# Patient Record
Sex: Male | Born: 2000 | Race: Black or African American | Hispanic: No | Marital: Single
Health system: Southern US, Community
[De-identification: ages and names within clinical notes are randomized; demographics above are authoritative.]

## PROBLEM LIST (undated history)

## (undated) DIAGNOSIS — J189 Pneumonia, unspecified organism: Secondary | ICD-10-CM

## (undated) DIAGNOSIS — E871 Hypo-osmolality and hyponatremia: Secondary | ICD-10-CM

## (undated) DIAGNOSIS — R739 Hyperglycemia, unspecified: Secondary | ICD-10-CM

## (undated) DIAGNOSIS — E119 Type 2 diabetes mellitus without complications: Secondary | ICD-10-CM

## (undated) DIAGNOSIS — I10 Essential (primary) hypertension: Secondary | ICD-10-CM

## (undated) DIAGNOSIS — N179 Acute kidney failure, unspecified: Secondary | ICD-10-CM

## (undated) HISTORY — DX: Hyperglycemia, unspecified: R73.9

## (undated) HISTORY — DX: Acute kidney failure, unspecified: N17.9

## (undated) HISTORY — DX: Hypo-osmolality and hyponatremia: E87.1

## (undated) NOTE — *Deleted (*Deleted)
3:25 PM  I assumed care of this patient at 1500 from Delbert Phenix, MD. Patient signed out pending BMP, CBC, and Troponin I. All imaging as interpreted by the reading Radiologist read as negative for acute abnormality. All lab results within normal limits ***. Patient is safe for discharge ***. Patient given strict return precautions.   Physical Exam  BP (!) 163/85   Pulse 79   Temp 98.3 F (36.8 C) (Oral) Comment: per NT  Resp 20   Wt (!) 261 lb 0.4 oz (118.4 kg)   SpO2 98%   Physical Exam  ED Course/Procedures     Procedures  MDM  *** Scribe's Attestation: Lewis Moccasin, MD obtained and performed the history, physical exam and medical decision making elements that were entered into the chart. Documentation assistance was provided by me personally, a scribe. Signed by Kathreen Cosier, Scribe on 11/15/2019 3:25 PM ? Documentation assistance provided by the scribe. I was present during the time the encounter was recorded. The information recorded by the scribe was done at my direction and has been reviewed and validated by me.  Vicki Mallet, MD

---
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
## 2021-02-04 ENCOUNTER — Other Ambulatory Visit: Payer: Self-pay | Admitting: Internal Medicine

## 2021-02-10 ENCOUNTER — Ambulatory Visit
Admission: EM | Admit: 2021-02-10 | Discharge: 2021-02-10 | Disposition: A | Discharge status: Home / Self Care | Payer: Managed Care, Other (non HMO) | Attending: Urgent Care | Admitting: Urgent Care

## 2021-02-10 ENCOUNTER — Telehealth: Payer: Self-pay | Admitting: Emergency Medicine

## 2021-02-10 ENCOUNTER — Other Ambulatory Visit: Payer: Self-pay

## 2021-02-10 ENCOUNTER — Telehealth: Payer: Self-pay | Admitting: Urgent Care

## 2021-02-10 ENCOUNTER — Encounter: Payer: Self-pay | Admitting: Emergency Medicine

## 2021-02-10 DIAGNOSIS — R42 Dizziness and giddiness: Secondary | ICD-10-CM | POA: Diagnosis not present

## 2021-02-10 DIAGNOSIS — R110 Nausea: Secondary | ICD-10-CM | POA: Diagnosis not present

## 2021-02-10 DIAGNOSIS — Z794 Long term (current) use of insulin: Secondary | ICD-10-CM

## 2021-02-10 DIAGNOSIS — E119 Type 2 diabetes mellitus without complications: Secondary | ICD-10-CM | POA: Diagnosis not present

## 2021-02-10 DIAGNOSIS — R519 Headache, unspecified: Secondary | ICD-10-CM

## 2021-02-10 LAB — POCT URINALYSIS DIP (MANUAL ENTRY)
Bilirubin, UA: NEGATIVE
Blood, UA: NEGATIVE
Glucose, UA: 500 mg/dL — AB
Leukocytes, UA: NEGATIVE
Nitrite, UA: NEGATIVE
Protein Ur, POC: NEGATIVE mg/dL
Spec Grav, UA: >=1.03 — AB (ref 1.010–1.025)
Urobilinogen, UA: 0.2 E.U./dL
pH, UA: 6 (ref 5.0–8.0)

## 2021-02-10 LAB — POCT FASTING CBG KUC MANUAL ENTRY: POCT Glucose (KUC): 293 mg/dL — AB (ref 70–99)

## 2021-02-10 MED ORDER — TRESIBA FLEXTOUCH 100 UNIT/ML ~~LOC~~ SOPN: 50.0000 [IU] | PEN_INJECTOR | Freq: Every day | SUBCUTANEOUS | 0 refills | Status: DC

## 2021-02-10 MED ORDER — NOVOLOG 70/30 FLEXPEN RELION (70-30) 100 UNIT/ML ~~LOC~~ SUPN: 20.0000 [IU] | PEN_INJECTOR | Freq: Two times a day (BID) | SUBCUTANEOUS | 0 refills | Status: DC

## 2021-02-10 MED ORDER — INSULIN LISPRO PROT & LISPRO (75-25 MIX) 100 UNIT/ML KWIKPEN: 15.0000 [IU] | PEN_INJECTOR | Freq: Two times a day (BID) | SUBCUTANEOUS | 0 refills | Status: DC

## 2021-02-10 NOTE — Telephone Encounter (Signed)
Novolog requires a prior authorization as notified by the pharmacy through fax. Unfortunately, after multiple attempts I have not been able to get in contact with his pharmacy. Their phone line does not work. Will have the patient simply take his Evaristo Bury for now and request that the pharmacy contact our office to find other options on his formulary.

## 2021-02-10 NOTE — ED Triage Notes (Signed)
Pt is DM type 2 and here after running out of insulin on 1/21 and misplacing his glucometer and being unable to take CBG readings for several days. States his sugars are 120 normally and he is well controlled and managed with last hospital admission for DKA being in 2017. Presents today with dizziness, headache and nausea since yesterday.

## 2021-02-10 NOTE — ED Provider Notes (Signed)
Dakota Silva   MRN: [id number] DOB: December 27, 2000  Subjective:   Dakota Silva is a 21 y.o. male with pmh of diabetes II treated with insulin presenting for medication refill of his insulin.  Ran out a few days ago.  He takes both immediate and long-acting insulin.  He is currently in the process of looking for a new PCP.  Has had some symptoms including dizziness, nausea, mild headaches.  Has a history of DKA, last episode was 02/2020. Last a1c was done 04/15/20 and was 10%.  Denies confusion, vision changes, chest pain, shortness of breath, abdominal pain, syncope.  No current facility-administered medications for this encounter.  Current Outpatient Medications:    Accu-Chek FastClix Lancets MISC, 1 each by Does not apply route as directed. Check sugar 6 x daily, Disp: 204 each, Rfl: 3   acetone, urine, test strip, Check ketones per protocol, Disp: 50 each, Rfl: 3   amLODipine (NORVASC) 10 MG tablet, Take 1 tablet (10 mg total) by mouth daily. Pt. Needs to make appt. With Cardiologist in order to receive further refills. Thank You. 1st Attempt., Disp: 30 tablet, Rfl: 0   amLODipine (NORVASC) 5 MG tablet, TAKE 2 TABLETS(10 MG) BY MOUTH DAILY, Disp: 90 tablet, Rfl: 3   Continuous Blood Gluc Sensor (DEXCOM G6 SENSOR) MISC, 1 Device by Does not apply route as directed., Disp: 9 each, Rfl: 3   Continuous Blood Gluc Transmit (DEXCOM G6 TRANSMITTER) MISC, 1 Device by Does not apply route as directed., Disp: 1 each, Rfl: 3   dapagliflozin propanediol (FARXIGA) 5 MG TABS tablet, Take 1 tablet (5 mg total) by mouth daily before breakfast., Disp: 30 tablet, Rfl: 6   glucagon 1 MG injection, Use for Severe Hypoglycemia . Inject 1 mg intramuscularly if unresponsive, unable to swallow, unconscious and/or has seizure, Disp: 2 kit, Rfl: 1   glucose blood (ACCU-CHEK GUIDE) test strip, Use as instructed for 6 checks per day plus per protocol for hyper/hypoglycemia, Disp: 200 each, Rfl: 5   ibuprofen  (ADVIL) 800 MG tablet, Take 1 tablet (800 mg total) by mouth 3 (three) times daily., Disp: 21 tablet, Rfl: 0   Insulin Aspart FlexPen 100 UNIT/ML SOPN, ADMINISTER UP TO 75 UNITS UNDER THE SKIN EVERY DAY AS DIRECTED, Disp: 30 mL, Rfl: 3   insulin degludec (TRESIBA FLEXTOUCH) 100 UNIT/ML FlexTouch Pen, ADMINISTER UP TO 50 UNITS UNDER THE SKIN EVERY DAY, Disp: 15 mL, Rfl: 0   Insulin Pen Needle (INSUPEN PEN NEEDLES) 32G X 4 MM MISC, Inject insulin via insulin pen 4 x daily, Disp: 150 each, Rfl: 3   lisinopril (ZESTRIL) 20 MG tablet, Take 1 tablet (20 mg total) by mouth daily., Disp: 90 tablet, Rfl: 3   metFORMIN (GLUCOPHAGE) 500 MG tablet, Take 1 tablet by mouth twice daily, Disp: 60 tablet, Rfl: 3   mupirocin ointment (BACTROBAN) 2 %, Apply 1 application topically 2 (two) times daily., Disp: 30 g, Rfl: 0   pantoprazole (PROTONIX) 20 MG tablet, TAKE 1 TABLET(20 MG) BY MOUTH DAILY, Disp: 60 tablet, Rfl: 0   rosuvastatin (CRESTOR) 5 MG tablet, Take 2 tablets (10 mg total) by mouth daily., Disp: 90 tablet, Rfl: 3   Vitamin D, Ergocalciferol, (DRISDOL) 1.25 MG (50000 UNIT) CAPS capsule, Take 1 capsule (50,000 Units total) by mouth every 7 (seven) days., Disp: 5 capsule, Rfl: 4   Allergies  Allergen Reactions   Fish Allergy Swelling    Mouth and face swelling    Past Medical History:  Diagnosis Date  Abnormal weight loss 09/27/2017   Acanthosis nigricans 01/20/2017   AKI (acute kidney injury) ([place])    Chest pain of uncertain etiology 05/25/3265   Diabetes mellitus without complication ([place])    DKA (diabetic ketoacidoses) 11/29/2016   Hyperglycemia    Hypertension    Hyponatremia    Maladaptive health behaviors affecting medical condition 10/13/2017   Morbid obesity ([place]) 01/20/2017   New onset of type 2 diabetes mellitus in pediatric patient ([place]) 09/21/2016   Noncompliance with diabetes treatment 09/27/2017   Palpitations 01/30/2020   Pneumonia    Uncontrolled type 2 diabetes mellitus with  hyperglycemia ([place]) 09/13/2018     History reviewed. No pertinent surgical history.  Family History  Problem Relation Age of Onset   Diabetes Mother    Hypertension Mother    Diabetes Father     Social History   Tobacco Use   Smoking status: Passive Smoke Exposure - Never Smoker   Smokeless tobacco: Never   Tobacco comments:    Family smokes outside  Substance Use Topics   Alcohol use: No   Drug use: No    ROS   Objective:   Vitals: BP 128/89    Pulse 84    Temp 98.9 F (37.2 C)    Resp 20    SpO2 98%   Physical Exam Constitutional:      General: He is not in acute distress.    Appearance: Normal appearance. He is well-developed and normal weight. He is not ill-appearing, toxic-appearing or diaphoretic.  HENT:     Head: Normocephalic and atraumatic.     Right Ear: External ear normal.     Left Ear: External ear normal.     Nose: Nose normal.     Mouth/Throat:     Mouth: Mucous membranes are moist.  Eyes:     General: No scleral icterus.       Right eye: No discharge.        Left eye: No discharge.     Extraocular Movements: Extraocular movements intact.     Conjunctiva/sclera: Conjunctivae normal.     Pupils: Pupils are equal, round, and reactive to light.  Cardiovascular:     Rate and Rhythm: Normal rate and regular rhythm.     Heart sounds: Normal heart sounds. No murmur heard.   No friction rub. No gallop.  Pulmonary:     Effort: Pulmonary effort is normal. No respiratory distress.     Breath sounds: Normal breath sounds. No stridor. No wheezing, rhonchi or rales.  Abdominal:     General: Bowel sounds are normal. There is no distension.     Palpations: Abdomen is soft. There is no mass.     Tenderness: There is no abdominal tenderness. There is no right CVA tenderness, left CVA tenderness, guarding or rebound.  Skin:    General: Skin is warm and dry.  Neurological:     Mental Status: He is alert and oriented to person, place, and time.     Cranial  Nerves: No cranial nerve deficit, dysarthria or facial asymmetry.     Motor: No weakness.     Coordination: Coordination normal.     Gait: Gait normal.  Psychiatric:        Mood and Affect: Mood normal.        Speech: Speech normal.        Behavior: Behavior normal.        Thought Content: Thought content normal.    Results for orders placed or  performed during the hospital encounter of 02/10/21 (from the past 24 hour(s))  POCT CBG (manual entry)     Status: Abnormal   Collection Time: 02/10/21  9:48 AM  Result Value Ref Range   POCT Glucose ([facility]) 293 (A) 70 - 99 mg/dL  POCT urinalysis dipstick     Status: Abnormal   Collection Time: 02/10/21  9:48 AM  Result Value Ref Range   Color, UA yellow yellow   Clarity, UA clear clear   Glucose, UA =500 (A) negative mg/dL   Bilirubin, UA negative negative   Ketones, POC UA small (15) (A) negative mg/dL   Spec Grav, UA >=1.030 (A) 1.010 - 1.025   Blood, UA negative negative   pH, UA 6.0 5.0 - 8.0   Protein Ur, POC negative negative mg/dL   Urobilinogen, UA 0.2 0.2 or 1.0 E.U./dL   Nitrite, UA Negative Negative   Leukocytes, UA Negative Negative    Assessment and Plan :   PDMP not reviewed this encounter.  1. Type 2 diabetes mellitus treated with insulin ([place])   2. Dizziness   3. Mild headache   4. Nausea without vomiting     I reviewed patient's medications with him very specifically.  He confirms that he is on NovoLog 70/30 and takes 20 units twice daily.  He is also on [place] and takes 50 units at bedtime.  I did help patient try to find a new primary care provider through the [facility] health website.  He plans on making an appointment as soon as possible.  At this point I am not concerned for DKA with the patient.  But emphasized strict diabetic control to avoid a recurrence of this. Counseled patient on potential for adverse effects with medications prescribed/recommended today, ER and return-to-clinic precautions discussed, patient  verbalized understanding.    Jaynee Eagles, PA-C 02/10/21 1038

## 2021-02-10 NOTE — Telephone Encounter (Signed)
RN Ubaldo Glassing was able to obtain alternatives.  We will use a different intermediate insulin of Humalog KwikPen 75/25 as this is on his formulary.  We will use 15 units twice daily with meals.

## 2021-02-11 ENCOUNTER — Telehealth: Payer: Self-pay | Admitting: Urgent Care

## 2021-02-11 MED ORDER — NOVOLOG 70/30 FLEXPEN RELION (70-30) 100 UNIT/ML ~~LOC~~ SUPN: 20.0000 [IU] | PEN_INJECTOR | Freq: Two times a day (BID) | SUBCUTANEOUS | 0 refills | Status: DC

## 2021-02-11 NOTE — Telephone Encounter (Signed)
Patient's mother called and requested a prescription be sent for NovoLog as the Humalog that is on there formulary is actually on backorder.  We notify patient's mother that she would have to pay out-of-pocket for this as it is not on her formulary and I am not able to do a prior authorization for this.  Prescription will be sent to the [facility] on [address] for NovoLog 70/30 at a dose of 20 units twice daily with meals.  Further refills need to be provided by the new PCP as we discussed in clinic.

## 2021-02-11 NOTE — Telephone Encounter (Signed)
Patient's mother called the clinic again.  She wants the prescription changed again.  I am agreeable to do this and will use short acting NovoLog.  I discussed this with the pharmacist directly and will be using 10 units 3 times daily with meals.  She will be dispensing 30 mils.  No refills.

## 2021-03-15 ENCOUNTER — Encounter (HOSPITAL_COMMUNITY): Payer: Self-pay | Admitting: *Deleted

## 2021-03-15 ENCOUNTER — Emergency Department (HOSPITAL_COMMUNITY): Payer: 59

## 2021-03-15 ENCOUNTER — Other Ambulatory Visit: Payer: Self-pay

## 2021-03-15 ENCOUNTER — Emergency Department (HOSPITAL_COMMUNITY)
Admission: EM | Admit: 2021-03-15 | Discharge: 2021-03-16 | Disposition: A | Discharge status: Home / Self Care | Payer: 59 | Attending: Emergency Medicine | Admitting: Emergency Medicine

## 2021-03-15 DIAGNOSIS — E119 Type 2 diabetes mellitus without complications: Secondary | ICD-10-CM | POA: Insufficient documentation

## 2021-03-15 DIAGNOSIS — W228XXA Striking against or struck by other objects, initial encounter: Secondary | ICD-10-CM | POA: Diagnosis not present

## 2021-03-15 DIAGNOSIS — I10 Essential (primary) hypertension: Secondary | ICD-10-CM | POA: Insufficient documentation

## 2021-03-15 DIAGNOSIS — Z794 Long term (current) use of insulin: Secondary | ICD-10-CM | POA: Diagnosis not present

## 2021-03-15 DIAGNOSIS — Z7984 Long term (current) use of oral hypoglycemic drugs: Secondary | ICD-10-CM | POA: Insufficient documentation

## 2021-03-15 DIAGNOSIS — Z23 Encounter for immunization: Secondary | ICD-10-CM | POA: Insufficient documentation

## 2021-03-15 DIAGNOSIS — S99921A Unspecified injury of right foot, initial encounter: Secondary | ICD-10-CM

## 2021-03-15 DIAGNOSIS — S90811A Abrasion, right foot, initial encounter: Secondary | ICD-10-CM | POA: Insufficient documentation

## 2021-03-15 DIAGNOSIS — Z79899 Other long term (current) drug therapy: Secondary | ICD-10-CM | POA: Insufficient documentation

## 2021-03-15 MED ORDER — NAPROXEN 500 MG PO TABS: 500.0000 mg | ORAL_TABLET | Freq: Two times a day (BID) | ORAL | 0 refills | Status: DC | PRN

## 2021-03-15 MED ORDER — NAPROXEN 500 MG PO TABS: 500.0000 mg | ORAL_TABLET | Freq: Two times a day (BID) | ORAL | 0 refills | Status: AC | PRN

## 2021-03-15 MED ORDER — BACITRACIN ZINC 500 UNIT/GM EX OINT
TOPICAL_OINTMENT | Freq: Two times a day (BID) | CUTANEOUS | Status: DC
  Filled 2021-03-15: qty 0.9

## 2021-03-15 MED ORDER — BACITRACIN ZINC 500 UNIT/GM EX OINT: 1.0000 "application " | TOPICAL_OINTMENT | Freq: Two times a day (BID) | CUTANEOUS | 0 refills | Status: DC

## 2021-03-15 MED ORDER — TETANUS-DIPHTH-ACELL PERTUSSIS 5-2.5-18.5 LF-MCG/0.5 IM SUSY
0.5000 mL | PREFILLED_SYRINGE | Freq: Once | INTRAMUSCULAR | Status: AC
  Administered 2021-03-15: 0.5 mL via INTRAMUSCULAR
  Filled 2021-03-15: qty 0.5

## 2021-03-15 MED ORDER — BACITRACIN ZINC 500 UNIT/GM EX OINT: 1.0000 "application " | TOPICAL_OINTMENT | Freq: Two times a day (BID) | CUTANEOUS | 0 refills | Status: AC

## 2021-03-15 MED ORDER — NAPROXEN 500 MG PO TABS
500.0000 mg | ORAL_TABLET | Freq: Once | ORAL | Status: AC
  Administered 2021-03-15: 500 mg via ORAL
  Filled 2021-03-15: qty 1
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
## 2021-03-15 NOTE — Discharge Instructions (Addendum)
Please read and follow all provided instructions.  You have been seen today for an injury to your right foot.   Tests performed today include: An x-ray of the affected area - does NOT show any broken bones or dislocations.  Vital signs. See below for your results today.   Home care instructions: -- *PRICE in the first 24-48 hours after injury: Protect (with brace, splint, sling), if given by your provider Rest Ice- Do not apply ice pack directly to your skin, place towel or similar between your skin and ice/ice pack. Apply ice for 20 min, then remove for 40 min while awake Compression- Wear brace, elastic bandage, splint as directed by your provider Elevate affected extremity above the level of your heart when not walking around for the first 24-48 hours   Medications:  - Naproxen is a nonsteroidal anti-inflammatory medication that will help with pain and swelling. Be sure to take this medication as prescribed with food, 1 pill every 12 hours,  It should be taken with food, as it can cause stomach upset, and more seriously, stomach bleeding. Do not take other nonsteroidal anti-inflammatory medications with this such as Advil, Motrin, Aleve, Mobic, Goodie Powder, or Motrin.    - Bacitracin- apply twice per day to wound.   You make take Tylenol per over the counter dosing with these medications.   We have prescribed you new medication(s) today. Discuss the medications prescribed today with your pharmacist as they can have adverse effects and interactions with your other medicines including over the counter and prescribed medications. Seek medical evaluation if you start to experience new or abnormal symptoms after taking one of these medicines, seek care immediately if you start to experience difficulty breathing, feeling of your throat closing, facial swelling, or rash as these could be indications of a more serious allergic reaction   Follow-up instructions: Please follow-up with your  primary care provider or the provided orthopedic physician (bone specialist) if you continue to have significant pain in 1 week. In this case you may have a more severe injury that requires further care. Follow attach wound care instructions. Use crutches as needed.   Return instructions:  Please return if your digits or extremity are numb or tingling, appear gray or blue, or you have severe pain (also elevate the extremity and loosen splint or wrap if you were given one) Please return if you have redness or fevers.  Please return to the Emergency Department if you experience worsening symptoms.  Please return if you have any other emergent concerns. Additional Information:  Your vital signs today were: BP (!) 169/96 (BP Location: Right Arm)    Pulse (!) 105    Temp 99.1 F (37.3 C) (Oral)    Resp 16    Ht 5\' 11"  (1.803 m)    Wt 108.9 kg    SpO2 100%    BMI 33.47 kg/m  If your blood pressure (BP) was elevated above 135/85 this visit, please have this repeated by your doctor within one month. ---------------
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
## 2021-03-16 ENCOUNTER — Other Ambulatory Visit: Payer: Self-pay | Admitting: Internal Medicine

## 2021-03-16 DIAGNOSIS — I10 Essential (primary) hypertension: Secondary | ICD-10-CM

## 2021-03-17 ENCOUNTER — Other Ambulatory Visit: Payer: Self-pay | Admitting: Internal Medicine

## 2021-03-27 ENCOUNTER — Other Ambulatory Visit: Payer: Self-pay

## 2021-03-27 ENCOUNTER — Encounter: Payer: Self-pay | Admitting: Emergency Medicine

## 2021-03-27 ENCOUNTER — Emergency Department
Admission: EM | Admit: 2021-03-27 | Discharge: 2021-03-27 | Disposition: A | Discharge status: Home / Self Care | Payer: 59 | Attending: Emergency Medicine | Admitting: Emergency Medicine

## 2021-03-27 DIAGNOSIS — E1165 Type 2 diabetes mellitus with hyperglycemia: Secondary | ICD-10-CM | POA: Insufficient documentation

## 2021-03-27 DIAGNOSIS — E119 Type 2 diabetes mellitus without complications: Secondary | ICD-10-CM

## 2021-03-27 DIAGNOSIS — Z794 Long term (current) use of insulin: Secondary | ICD-10-CM | POA: Insufficient documentation

## 2021-03-27 DIAGNOSIS — I10 Essential (primary) hypertension: Secondary | ICD-10-CM | POA: Insufficient documentation

## 2021-03-27 DIAGNOSIS — R739 Hyperglycemia, unspecified: Secondary | ICD-10-CM

## 2021-03-27 LAB — CBG MONITORING, ED
Glucose-Capillary: 455 mg/dL — ABNORMAL HIGH (ref 70–99)
Glucose-Capillary: 275 mg/dL — ABNORMAL HIGH (ref 70–99)

## 2021-03-27 LAB — CBC WITH DIFFERENTIAL/PLATELET
Abs Immature Granulocytes: 0.03 10*3/uL (ref 0.00–0.07)
Basophils Absolute: 0 10*3/uL (ref 0.0–0.1)
Basophils Relative: 0 %
Eosinophils Absolute: 0.1 10*3/uL (ref 0.0–0.5)
Eosinophils Relative: 2 %
HCT: 41.9 % (ref 39.0–52.0)
Hemoglobin: 12.7 g/dL — ABNORMAL LOW (ref 13.0–17.0)
Immature Granulocytes: 0 %
Lymphocytes Relative: 34 %
Lymphs Abs: 2.8 10*3/uL (ref 0.7–4.0)
MCH: 20.4 pg — ABNORMAL LOW (ref 26.0–34.0)
MCHC: 30.3 g/dL (ref 30.0–36.0)
MCV: 67.3 fL — ABNORMAL LOW (ref 80.0–100.0)
Monocytes Absolute: 0.4 10*3/uL (ref 0.1–1.0)
Monocytes Relative: 4 %
Neutro Abs: 4.8 10*3/uL (ref 1.7–7.7)
Neutrophils Relative %: 60 %
Platelets: 387 10*3/uL (ref 150–400)
RBC: 6.23 MIL/uL — ABNORMAL HIGH (ref 4.22–5.81)
RDW: 15.1 % (ref 11.5–15.5)
WBC: 8.1 10*3/uL (ref 4.0–10.5)
nRBC: 0 % (ref 0.0–0.2)

## 2021-03-27 LAB — BASIC METABOLIC PANEL
Anion gap: 10 (ref 5–15)
BUN: 11 mg/dL (ref 6–20)
CO2: 26 mmol/L (ref 22–32)
Calcium: 9.4 mg/dL (ref 8.9–10.3)
Chloride: 93 mmol/L — ABNORMAL LOW (ref 98–111)
Creatinine, Ser: 0.83 mg/dL (ref 0.61–1.24)
GFR, Estimated: >60 mL/min (ref >60)
Glucose, Bld: 450 mg/dL — ABNORMAL HIGH (ref 70–99)
Potassium: 4.1 mmol/L (ref 3.5–5.1)
Sodium: 129 mmol/L — ABNORMAL LOW (ref 135–145)

## 2021-03-27 LAB — BETA-HYDROXYBUTYRIC ACID: Beta-Hydroxybutyric Acid: 0.09 mmol/L (ref 0.05–0.27)

## 2021-03-27 MED ORDER — ACCU-CHEK FASTCLIX LANCETS MISC: 1.0000 | 1 refills | Status: DC

## 2021-03-27 MED ORDER — NOVOLOG 70/30 FLEXPEN RELION (70-30) 100 UNIT/ML ~~LOC~~ SUPN: 20.0000 [IU] | PEN_INJECTOR | Freq: Two times a day (BID) | SUBCUTANEOUS | 1 refills | Status: AC

## 2021-03-27 MED ORDER — TRESIBA FLEXTOUCH 100 UNIT/ML ~~LOC~~ SOPN: 50.0000 [IU] | PEN_INJECTOR | Freq: Every day | SUBCUTANEOUS | 1 refills | Status: AC

## 2021-03-27 MED ORDER — INSULIN ASPART 100 UNIT/ML IJ SOLN
10.0000 [IU] | Freq: Once | INTRAMUSCULAR | Status: AC
  Administered 2021-03-27: 10 [IU] via INTRAVENOUS
  Filled 2021-03-27: qty 1

## 2021-03-27 MED ORDER — SODIUM CHLORIDE 0.9 % IV BOLUS
1000.0000 mL | Freq: Once | INTRAVENOUS | Status: AC
  Administered 2021-03-27: 1000 mL via INTRAVENOUS

## 2021-03-27 MED ORDER — DAPAGLIFLOZIN PROPANEDIOL 5 MG PO TABS: 5.0000 mg | ORAL_TABLET | Freq: Every day | ORAL | 1 refills | Status: AC

## 2021-03-27 MED ORDER — ACCU-CHEK GUIDE VI STRP: ORAL_STRIP | 1 refills | Status: DC

## 2021-03-27 NOTE — ED Triage Notes (Signed)
Pt reports has diabetes and has been out of his insulin for 3 days so his blood sugar is running high. ?

## 2021-03-27 NOTE — ED Provider Notes (Signed)
? ?[facility] ?Provider Note ? ? ? Event Date/Time  ? First MD Initiated Contact with Patient 03/27/21 1046   ?  (approximate) ? ? ?History  ? ?Hyperglycemia ? ? ?HPI ? ?Dakota Silva is a 21 y.o. male   with history of type 2 diabetes, hypertension, here with hyperglycemia.  Patient states that he recently ran out of insulin.  He actually does not have a PCP and is scheduled to see 1 in May but was unable to get back in with anyone.  He states that he has been out of his short acting insulin.  Reports has been mildly thirsty but denies any shortness of breath, nausea, or vomiting.  Has a history of DKA and states he does not feel like he is in this.  Denies any fevers or chills.  No recent illnesses.  No other medical complaints.  States he has been taking most of his other medications. ? ?  ? ? ?Physical Exam  ? ?Triage Vital Signs: ?ED Triage Vitals  ?Enc Vitals Group  ?   BP 03/27/21 1021 (!) 142/88  ?   Pulse Rate 03/27/21 1021 91  ?   Resp 03/27/21 1021 18  ?   Temp 03/27/21 1021 98.5 ?F (36.9 ?C)  ?   Temp Source 03/27/21 1021 Oral  ?   SpO2 03/27/21 1021 98 %  ?   Weight 03/27/21 1012 245 lb (111.1 kg)  ?   Height 03/27/21 1012 5\' 11"  (1.803 m)  ?   Head Circumference --   ?   Peak Flow --   ?   Pain Score 03/27/21 1012 0  ?   Pain Loc --   ?   Pain Edu? --   ?   Excl. in GC? --   ? ? ?Most recent vital signs: ?Vitals:  ? 03/27/21 1130 03/27/21 1245  ?BP: (!) 154/83 (!) 144/69  ?Pulse: 80 80  ?Resp:  16  ?Temp:    ?SpO2: 97% 98%  ? ? ? ?General: Awake, no distress.  ?CV:  Good peripheral perfusion.  Regular rate and rhythm, no murmurs ?Resp:  Normal effort.  Lungs clear to auscultation bilaterally.  No tachypnea. ?Abd:  No distention.  ?Other:  Mildly dry mucous membranes. ? ? ?ED Results / Procedures / Treatments  ? ?Labs ?(all labs ordered are listed, but only abnormal results are displayed) ?Labs Reviewed  ?BASIC METABOLIC PANEL - Abnormal; Notable for the following components:  ?     Result Value  ? Sodium 129 (*)   ? Chloride 93 (*)   ? Glucose, Bld 450 (*)   ? All other components within normal limits  ?CBC WITH DIFFERENTIAL/PLATELET - Abnormal; Notable for the following components:  ? RBC 6.23 (*)   ? Hemoglobin 12.7 (*)   ? MCV 67.3 (*)   ? MCH 20.4 (*)   ? All other components within normal limits  ?CBG MONITORING, ED - Abnormal; Notable for the following components:  ? Glucose-Capillary 455 (*)   ? All other components within normal limits  ?CBG MONITORING, ED - Abnormal; Notable for the following components:  ? Glucose-Capillary 275 (*)   ? All other components within normal limits  ?BETA-HYDROXYBUTYRIC ACID  ? ? ? ?EKG ? ? ? ?RADIOLOGY ? ?PROCEDURES: ? ?Critical Care performed: No ? ? ?MEDICATIONS ORDERED IN ED: ?Medications  ?insulin aspart (novoLOG) injection 10 Units (10 Units Intravenous Given 03/27/21 1113)  ?sodium chloride 0.9 % bolus 1,000 mL (0  mLs Intravenous Stopped 03/27/21 1150)  ? ? ? ?IMPRESSION / MDM / ASSESSMENT AND PLAN / ED COURSE  ?I reviewed the triage vital signs and the nursing notes. ?             ?               ? ?MDM:  ?Well-appearing 21 year old male with type 2 diabetes here with hyperglycemia and need for medication refills.  Overall, the patient is very well-appearing and in no distress.  He is hyperglycemic but has bicarb of 26, normal anion gap of 10, and negative beta hydroxybutyric acid.  Do not suspect DKA.  CBC is largely unremarkable.   ? ?Pt given fluids, insulin with improvement in BG. He remains HDS. I had a long discussion with pt and his mother re: need for adherence with medications. He has an appt with a new PCP in the next month. Will d/c with brief course of his chronic meds including insulin, encouraged dietary modifications, and d/c home. Pt in agreement.  ? ? ? ? ?MEDICATIONS GIVEN IN ED: ?Medications  ?insulin aspart (novoLOG) injection 10 Units (10 Units Intravenous Given 03/27/21 1113)  ?sodium chloride 0.9 % bolus 1,000 mL (0 mLs  Intravenous Stopped 03/27/21 1150)  ? ? ? ?Consults:  ?None ? ? ?EMR reviewed  ?Prior ED visits ? ? ? ? ?FINAL CLINICAL IMPRESSION(S) / ED DIAGNOSES  ? ?Final diagnoses:  ?Hyperglycemia  ?Type 2 diabetes mellitus without complication, with long-term current use of insulin (HCC)  ? ? ? ?Rx / DC Orders  ? ?ED Discharge Orders   ? ?      Ordered  ?  insulin aspart protamine - aspart (NOVOLOG 70/30 FLEXPEN) (70-30) 100 UNIT/ML FlexPen  2 times daily with meals       ?Note to Pharmacy: Patient and his mother is specifically requesting Novolog pen. They will pay out of pocket as I cannot do a prior authorization.  ? 03/27/21 1235  ?  glucose blood (ACCU-CHEK GUIDE) test strip       ? 03/27/21 1235  ?  Accu-Chek FastClix Lancets MISC  As directed       ?Note to Pharmacy: Lancets come in boxes of 102 each. Please dispense 2 boxes. For questions regarding this prescription please call [phone].  ? 03/27/21 1235  ?  dapagliflozin propanediol (FARXIGA) 5 MG TABS tablet  Daily before breakfast       ? 03/27/21 1235  ?  insulin degludec (TRESIBA FLEXTOUCH) 100 UNIT/ML FlexTouch Pen  Daily       ? 03/27/21 1235  ? ?  ?  ? ?  ? ? ? ?Note:  This document was prepared using Dragon voice recognition software and may include unintentional dictation errors. ?  ?Shaune Pollack, MD ?03/27/21 1810 ? ?

## 2021-03-27 NOTE — ED Notes (Signed)
Pt up to use bathroom 

## 2021-03-27 NOTE — ED Notes (Signed)
CBG now 275. ?

## 2021-04-05 ENCOUNTER — Other Ambulatory Visit: Payer: Self-pay | Admitting: Internal Medicine

## 2021-04-20 ENCOUNTER — Other Ambulatory Visit: Payer: Self-pay | Admitting: Internal Medicine

## 2021-04-20 DIAGNOSIS — I10 Essential (primary) hypertension: Secondary | ICD-10-CM

## 2021-04-23 ENCOUNTER — Encounter: Payer: Self-pay | Admitting: Emergency Medicine

## 2021-04-23 ENCOUNTER — Ambulatory Visit
Admission: EM | Admit: 2021-04-23 | Discharge: 2021-04-23 | Disposition: A | Discharge status: Home / Self Care | Payer: 59 | Attending: Emergency Medicine | Admitting: Emergency Medicine

## 2021-04-23 DIAGNOSIS — I10 Essential (primary) hypertension: Secondary | ICD-10-CM | POA: Diagnosis not present

## 2021-04-23 DIAGNOSIS — Z760 Encounter for issue of repeat prescription: Secondary | ICD-10-CM | POA: Diagnosis not present

## 2021-04-23 MED ORDER — AMLODIPINE BESYLATE 10 MG PO TABS: 10.0000 mg | ORAL_TABLET | Freq: Every day | ORAL | 0 refills | Status: DC

## 2021-04-23 NOTE — Discharge Instructions (Addendum)
Take the amlodipine as directed.  Establish a primary care provider as soon as possible.  ?

## 2021-04-23 NOTE — ED Provider Notes (Signed)
?[facility] ? ? ? ?CSN: [id number] ?Arrival date & time: 04/23/21  1406 ? ? ?  ? ?History   ?Chief Complaint ?Chief Complaint  ?Patient presents with  ? Medication Refill  ? ? ?HPI ?Dakota Silva is a 21 y.o. male.  Accompanied by his mother, patient presents for a refill of his blood pressure medication.  He reports he is asymptomatic and denies dizziness, headache, weakness, chest pain, shortness of breath, or other symptoms.  He declines to have his blood sugar checked today.  He does not currently have a PCP but has an appointment to establish with a [employer] in [place] next month.  His medical history includes essential hypertension, type 2 diabetes, DKA, obesity.  Patient was seen at [facility] ED on 03/27/2021; diagnosed with uncontrolled type 2 diabetes with hyperglycemia; his insulin was refilled at that time. ? ?The history is provided by the patient, a parent and medical records.  ? ?Past Medical History:  ?Diagnosis Date  ? Abnormal weight loss 09/27/2017  ? Acanthosis nigricans 01/20/2017  ? AKI (acute kidney injury) ([place])   ? Chest pain of uncertain etiology [id number]  ? Diabetes mellitus without complication ([place])   ? DKA (diabetic ketoacidoses) 11/29/2016  ? Hyperglycemia   ? Hypertension   ? Hyponatremia   ? Maladaptive health behaviors affecting medical condition 10/13/2017  ? Morbid obesity ([place]) 01/20/2017  ? New onset of type 2 diabetes mellitus in pediatric patient ([place]) 09/21/2016  ? Noncompliance with diabetes treatment 09/27/2017  ? Palpitations 01/30/2020  ? Pneumonia   ? Uncontrolled type 2 diabetes mellitus with hyperglycemia ([place]) 09/13/2018  ? ? ?Patient Active Problem List  ? Diagnosis Date Noted  ? Chest pain of uncertain etiology [id number]  ? Palpitations 01/30/2020  ? Obesity, diabetes, and hypertension syndrome ([place]) 12/31/2019  ? Uncontrolled type 2 diabetes mellitus with hyperglycemia ([place]) 09/13/2018  ? Maladaptive health behaviors affecting medical condition 10/13/2017  ?  Noncompliance with diabetes treatment 09/27/2017  ? Abnormal weight loss 09/27/2017  ? Insulin dose changed ([place]) 09/27/2017  ? Acanthosis nigricans 01/20/2017  ? Morbid obesity ([place]) 01/20/2017  ? AKI (acute kidney injury) ([place])   ? Dehydration   ? Hyperglycemia   ? Hyponatremia   ? DKA (diabetic ketoacidoses) 11/29/2016  ? Essential hypertension in pediatric patient 09/21/2016  ? New onset of type 2 diabetes mellitus in pediatric patient ([place]) 09/21/2016  ? Anemia 09/21/2016  ? ? ?History reviewed. No pertinent surgical history. ? ? ? ? ?Home Medications   ? ?Prior to Admission medications   ?Medication Sig Start Date End Date Taking? Authorizing Provider  ?amLODipine (NORVASC) 10 MG tablet Take 1 tablet (10 mg total) by mouth daily. 04/23/21  Yes Sharion Balloon, NP  ?Accu-Chek FastClix Lancets MISC 1 each by Does not apply route as directed. Check sugar 6 x daily 03/27/21   Duffy Bruce, MD  ?acetone, urine, test strip Check ketones per protocol 01/01/17   Lelon Huh, MD  ?bacitracin ointment Apply 1 application topically 2 (two) times daily. 03/15/21   Petrucelli, Glynda Jaeger, PA-C  ?Continuous Blood Gluc Sensor (DEXCOM G6 SENSOR) MISC 1 Device by Does not apply route as directed. 04/15/20   Shamleffer, Melanie Crazier, MD  ?Continuous Blood Gluc Transmit (DEXCOM G6 TRANSMITTER) MISC 1 Device by Does not apply route as directed. 04/15/20   Shamleffer, Melanie Crazier, MD  ?dapagliflozin propanediol (FARXIGA) 5 MG TABS tablet Take 1 tablet (5 mg total) by mouth daily before breakfast. 03/27/21   Ellender Hose,  Lysbeth Galas, MD  ?glucagon 1 MG injection Use for Severe Hypoglycemia . Inject 1 mg intramuscularly if unresponsive, unable to swallow, unconscious and/or has seizure 11/15/19   [name], NP  ?glucose blood (ACCU-CHEK GUIDE) test strip Use as instructed for 6 checks per day plus per protocol for hyper/hypoglycemia 03/27/21   Duffy Bruce, MD  ?ibuprofen (ADVIL) 800 MG tablet Take 1 tablet (800 mg total) by  mouth 3 (three) times daily. 01/21/20   Wieters, Elesa Hacker, PA-C  ?Insulin Aspart FlexPen 100 UNIT/ML SOPN ADMINISTER UP TO 75 UNITS UNDER THE SKIN EVERY DAY AS DIRECTED 01/22/20   Just, Laurita Quint, FNP  ?insulin aspart protamine - aspart (NOVOLOG 70/30 FLEXPEN) (70-30) 100 UNIT/ML FlexPen Inject 20 Units into the skin 2 (two) times daily with a meal. 03/27/21   Duffy Bruce, MD  ?insulin degludec (TRESIBA FLEXTOUCH) 100 UNIT/ML FlexTouch Pen Inject 50 Units into the skin daily. 03/27/21   Duffy Bruce, MD  ?Insulin Pen Needle (INSUPEN PEN NEEDLES) 32G X 4 MM MISC Inject insulin via insulin pen 4 x daily 01/22/20   Just, Laurita Quint, FNP  ?lisinopril (ZESTRIL) 20 MG tablet TAKE 1 TABLET(20 MG) BY MOUTH DAILY 04/20/21   Rudean Haskell A, MD  ?metFORMIN (GLUCOPHAGE) 500 MG tablet Take 1 tablet by mouth twice daily 01/22/20   Just, Laurita Quint, FNP  ?mupirocin ointment (BACTROBAN) 2 % Apply 1 application topically 2 (two) times daily. 01/21/20   Wieters, Hallie C, PA-C  ?naproxen (NAPROSYN) 500 MG tablet Take 1 tablet (500 mg total) by mouth 2 (two) times daily as needed for moderate pain. 03/15/21   Petrucelli, Samantha R, PA-C  ?pantoprazole (PROTONIX) 20 MG tablet TAKE 1 TABLET(20 MG) BY MOUTH DAILY 02/05/20   Just, Laurita Quint, FNP  ?rosuvastatin (CRESTOR) 5 MG tablet Take 2 tablets (10 mg total) by mouth daily. 02/11/20   Just, Laurita Quint, FNP  ?Vitamin D, Ergocalciferol, (DRISDOL) 1.25 MG (50000 UNIT) CAPS capsule Take 1 capsule (50,000 Units total) by mouth every 7 (seven) days. 02/12/20   Just, Laurita Quint, FNP  ? ? ?Family History ?Family History  ?Problem Relation Age of Onset  ? Diabetes Mother   ? Hypertension Mother   ? Diabetes Father   ? ? ?Social History ?Social History  ? ?Tobacco Use  ? Smoking status: Passive Smoke Exposure - Never Smoker  ? Smokeless tobacco: Never  ? Tobacco comments:  ?  Family smokes outside  ?Substance Use Topics  ? Alcohol use: No  ? Drug use: No  ? ? ? ?Allergies   ?Fish allergy ? ? ?Review of  Systems ?Review of Systems  ?Respiratory:  Negative for cough and shortness of breath.   ?Cardiovascular:  Negative for chest pain and palpitations.  ?Gastrointestinal:  Negative for abdominal pain, nausea and vomiting.  ?Neurological:  Negative for dizziness, weakness, numbness and headaches.  ?All other systems reviewed and are negative. ? ? ?Physical Exam ?Triage Vital Signs ?ED Triage Vitals  ?Enc Vitals Group  ?   BP   ?   Pulse   ?   Resp   ?   Temp   ?   Temp src   ?   SpO2   ?   Weight   ?   Height   ?   Head Circumference   ?   Peak Flow   ?   Pain Score   ?   Pain Loc   ?   Pain Edu?   ?   Excl.  in [place]?   ? ?No data found. ? ?Updated Vital Signs ?BP 126/83   Pulse 86   Temp 98 ?F (36.7 ?C)   Resp 18   SpO2 98%  ? ?Visual Acuity ?Right Eye Distance:   ?Left Eye Distance:   ?Bilateral Distance:   ? ?Right Eye Near:   ?Left Eye Near:    ?Bilateral Near:    ? ?Physical Exam ?Vitals and nursing note reviewed.  ?Constitutional:   ?   General: He is not in acute distress. ?   Appearance: He is well-developed. He is not ill-appearing.  ?HENT:  ?   Mouth/Throat:  ?   Mouth: Mucous membranes are moist.  ?Cardiovascular:  ?   Rate and Rhythm: Normal rate and regular rhythm.  ?   Heart sounds: Normal heart sounds.  ?Pulmonary:  ?   Effort: Pulmonary effort is normal. No respiratory distress.  ?   Breath sounds: Normal breath sounds.  ?Abdominal:  ?   Palpations: Abdomen is soft.  ?   Tenderness: There is no abdominal tenderness.  ?Musculoskeletal:  ?   Cervical back: Neck supple.  ?   Right lower leg: No edema.  ?   Left lower leg: No edema.  ?Skin: ?   General: Skin is warm and dry.  ?Neurological:  ?   General: No focal deficit present.  ?   Mental Status: He is alert and oriented to person, place, and time.  ?   Gait: Gait normal.  ?Psychiatric:     ?   Mood and Affect: Mood normal.     ?   Behavior: Behavior normal.  ? ? ? ?UC Treatments / Results  ?Labs ?(all labs ordered are listed, but only abnormal results  are displayed) ?Labs Reviewed - No data to display ? ?EKG ? ? ?Radiology ?No results found. ? ?Procedures ?Procedures (including critical care time) ? ?Medications Ordered in UC ?Medications - No data to displ

## 2021-04-23 NOTE — ED Triage Notes (Addendum)
Pt ran out of BP meds 2 days ago and needs a refill. States his BP has been going up steadily for 2 days.  ?

## 2021-05-05 ENCOUNTER — Other Ambulatory Visit: Payer: Self-pay | Admitting: Internal Medicine

## 2021-05-05 DIAGNOSIS — I10 Essential (primary) hypertension: Secondary | ICD-10-CM

## 2022-10-26 ENCOUNTER — Ambulatory Visit
Admission: EM | Admit: 2022-10-26 | Discharge: 2022-10-26 | Disposition: A | Discharge status: Home / Self Care | Payer: Medicaid Other | Attending: Emergency Medicine | Admitting: Emergency Medicine

## 2022-10-26 DIAGNOSIS — Z113 Encounter for screening for infections with a predominantly sexual mode of transmission: Secondary | ICD-10-CM | POA: Diagnosis present

## 2022-10-26 DIAGNOSIS — Z202 Contact with and (suspected) exposure to infections with a predominantly sexual mode of transmission: Secondary | ICD-10-CM | POA: Diagnosis present

## 2022-10-26 NOTE — Discharge Instructions (Signed)
Your tests are pending.  If your test results are positive, we will call you.  You and your sexual partner(s) may require treatment at that time.  Do not have sexual activity for at least 7 days.    Follow up with your primary care provider.    

## 2022-10-26 NOTE — ED Provider Notes (Signed)
Renaldo Fiddler    CSN: [id number] Arrival date & time: 10/26/22  0903      History   Chief Complaint Chief Complaint  Patient presents with   SEXUALLY TRANSMITTED DISEASE    HPI Dakota Silva is a 22 y.o. male.  Patient presents with concern for exposure to an unknown STD.  He states his sexual partner is currently pregnant and told him that he needs to go to the doctor for STD testing.  He states she told him that she has an STD and that their in utero baby also has it.  He reports she will not tell him what STD she has.  Their last sexual contact was in July.  He denies symptoms.  He denies rash, penile discharge, testicular pain, abdominal pain, dysuria, hematuria, or other symptoms.  The history is provided by the patient and medical records.    Past Medical History:  Diagnosis Date   Abnormal weight loss 09/27/2017   Acanthosis nigricans 01/20/2017   AKI (acute kidney injury) ([facility])    Chest pain of uncertain etiology 01/30/2020   Diabetes mellitus without complication ([facility])    DKA (diabetic ketoacidoses) 11/29/2016   Hyperglycemia    Hypertension    Hyponatremia    Maladaptive health behaviors affecting medical condition 10/13/2017   Morbid obesity ([facility]) 01/20/2017   New onset of type 2 diabetes mellitus in pediatric patient ([facility]) 09/21/2016   Noncompliance with diabetes treatment 09/27/2017   Palpitations 01/30/2020   Pneumonia    Uncontrolled type 2 diabetes mellitus with hyperglycemia ([facility]) 09/13/2018    Patient Active Problem List   Diagnosis Date Noted   Chest pain of uncertain etiology 01/30/2020   Palpitations 01/30/2020   Obesity, diabetes, and hypertension syndrome ([facility]) 12/31/2019   Uncontrolled type 2 diabetes mellitus with hyperglycemia ([facility]) 09/13/2018   Maladaptive health behaviors affecting medical condition 10/13/2017   Noncompliance with diabetes treatment 09/27/2017   Abnormal weight loss 09/27/2017   Insulin dose changed ([facility]) 09/27/2017    Acanthosis nigricans 01/20/2017   Morbid obesity ([facility]) 01/20/2017   AKI (acute kidney injury) ([facility])    Dehydration    Hyperglycemia    Hyponatremia    DKA (diabetic ketoacidosis) ([facility]) 11/29/2016   Essential hypertension in pediatric patient 09/21/2016   New onset of type 2 diabetes mellitus in pediatric patient ([facility]) 09/21/2016   Anemia 09/21/2016    History reviewed. No pertinent surgical history.     Home Medications    Prior to Admission medications   Medication Sig Start Date End Date Taking? Authorizing Provider  Accu-Chek FastClix Lancets MISC 1 each by Does not apply route as directed. Check sugar 6 x daily 03/27/21   Shaune Pollack, MD  acetone, urine, test strip Check ketones per protocol 01/01/17   Dessa Phi, MD  amLODipine (NORVASC) 10 MG tablet Take 1 tablet (10 mg total) by mouth daily. 04/23/21   Mickie Bail, NP  bacitracin ointment Apply 1 application topically 2 (two) times daily. 03/15/21   Petrucelli, Samantha R, PA-C  Continuous Blood Gluc Sensor (DEXCOM G6 SENSOR) MISC 1 Device by Does not apply route as directed. 04/15/20   Shamleffer, Konrad Dolores, MD  Continuous Blood Gluc Transmit (DEXCOM G6 TRANSMITTER) MISC 1 Device by Does not apply route as directed. 04/15/20   Shamleffer, Konrad Dolores, MD  dapagliflozin propanediol (FARXIGA) 5 MG TABS tablet Take 1 tablet (5 mg total) by mouth daily before breakfast. 03/27/21   Shaune Pollack, MD  glucagon 1 MG injection Use  for Severe Hypoglycemia . Inject 1 mg intramuscularly if unresponsive, unable to swallow, unconscious and/or has seizure 11/15/19   Gretchen Short, NP  glucose blood (ACCU-CHEK GUIDE) test strip Use as instructed for 6 checks per day plus per protocol for hyper/hypoglycemia 03/27/21   Shaune Pollack, MD  ibuprofen (ADVIL) 800 MG tablet Take 1 tablet (800 mg total) by mouth 3 (three) times daily. 01/21/20   Wieters, Hallie C, PA-C  Insulin Aspart FlexPen 100 UNIT/ML SOPN ADMINISTER UP TO 75  UNITS UNDER THE SKIN EVERY DAY AS DIRECTED 01/22/20   Just, Azalee Course, FNP  insulin aspart protamine - aspart (NOVOLOG 70/30 FLEXPEN) (70-30) 100 UNIT/ML FlexPen Inject 20 Units into the skin 2 (two) times daily with a meal. 03/27/21   Shaune Pollack, MD  insulin degludec (TRESIBA FLEXTOUCH) 100 UNIT/ML FlexTouch Pen Inject 50 Units into the skin daily. 03/27/21   Shaune Pollack, MD  Insulin Pen Needle (INSUPEN PEN NEEDLES) 32G X 4 MM MISC Inject insulin via insulin pen 4 x daily 01/22/20   Just, Azalee Course, FNP  lisinopril (ZESTRIL) 20 MG tablet TAKE 1 TABLET(20 MG) BY MOUTH DAILY 04/20/21   Riley Lam A, MD  metFORMIN (GLUCOPHAGE) 500 MG tablet Take 1 tablet by mouth twice daily 01/22/20   Just, Azalee Course, FNP  mupirocin ointment (BACTROBAN) 2 % Apply 1 application topically 2 (two) times daily. 01/21/20   Wieters, Hallie C, PA-C  naproxen (NAPROSYN) 500 MG tablet Take 1 tablet (500 mg total) by mouth 2 (two) times daily as needed for moderate pain. 03/15/21   Petrucelli, Samantha R, PA-C  pantoprazole (PROTONIX) 20 MG tablet TAKE 1 TABLET(20 MG) BY MOUTH DAILY 02/05/20   Just, Azalee Course, FNP  rosuvastatin (CRESTOR) 5 MG tablet Take 2 tablets (10 mg total) by mouth daily. 02/11/20   Just, Azalee Course, FNP  Vitamin D, Ergocalciferol, (DRISDOL) 1.25 MG (50000 UNIT) CAPS capsule Take 1 capsule (50,000 Units total) by mouth every 7 (seven) days. 02/12/20   Just, Azalee Course, FNP    Family History Family History  Problem Relation Age of Onset   Diabetes Mother    Hypertension Mother    Diabetes Father     Social History Social History   Tobacco Use   Smoking status: Passive Smoke Exposure - Never Smoker   Smokeless tobacco: Never   Tobacco comments:    Family smokes outside  Substance Use Topics   Alcohol use: No   Drug use: No     Allergies   Fish allergy   Review of Systems Review of Systems  Constitutional:  Negative for chills and fever.  Gastrointestinal:  Negative for abdominal pain,  nausea and vomiting.  Genitourinary:  Negative for dysuria, flank pain, frequency, genital sores, hematuria, penile discharge and testicular pain.  Skin:  Negative for color change, rash and wound.     Physical Exam Triage Vital Signs ED Triage Vitals  Encounter Vitals Group     BP      Systolic BP Percentile      Diastolic BP Percentile      Pulse      Resp      Temp      Temp src      SpO2      Weight      Height      Head Circumference      Peak Flow      Pain Score      Pain Loc      Pain  Education      Exclude from Growth Chart    No data found.  Updated Vital Signs BP 122/86   Pulse 99   Temp 97.9 F (36.6 C)   Resp 18   SpO2 98%   Visual Acuity Right Eye Distance:   Left Eye Distance:   Bilateral Distance:    Right Eye Near:   Left Eye Near:    Bilateral Near:     Physical Exam Constitutional:      General: He is not in acute distress. HENT:     Mouth/Throat:     Mouth: Mucous membranes are moist.  Cardiovascular:     Rate and Rhythm: Normal rate and regular rhythm.  Pulmonary:     Effort: Pulmonary effort is normal. No respiratory distress.  Abdominal:     General: Bowel sounds are normal.     Palpations: Abdomen is soft.     Tenderness: There is no abdominal tenderness. There is no right CVA tenderness, left CVA tenderness, guarding or rebound.  Genitourinary:    Comments: Patient declines GU exam. Skin:    General: Skin is warm and dry.  Neurological:     Mental Status: He is alert.  Psychiatric:        Mood and Affect: Mood normal.        Behavior: Behavior normal.      UC Treatments / Results  Labs (all labs ordered are listed, but only abnormal results are displayed) Labs Reviewed  HIV ANTIBODY (ROUTINE TESTING W REFLEX)  RPR  CYTOLOGY, (ORAL, ANAL, URETHRAL) ANCILLARY ONLY    EKG   Radiology No results found.  Procedures Procedures (including critical care time)  Medications Ordered in UC Medications - No data  to display  Initial Impression / Assessment and Plan / UC Course  I have reviewed the triage vital signs and the nursing notes.  Pertinent labs & imaging results that were available during my care of the patient were reviewed by me and considered in my medical decision making (see chart for details).    STD screening, exposure to unknown STD.  Patient reports his sexual partner and their in utero baby have an STD.  The sexual partner will not tell him the name of the STD.  Patient is asymptomatic.  Urethral self swab obtained for cytology.  HIV and syphilis also pending.  Discussed with patient that we will call him if the test results are positive.  Instructed him to abstain from sexual activity until all the test results are back.  Instructed him to follow-up with his PCP.  He agrees to plan of care.  Final Clinical Impressions(s) / UC Diagnoses   Final diagnoses:  Screening for STD (sexually transmitted disease)  Exposure to sexually transmitted disease (STD)     Discharge Instructions      Your tests are pending.  If your test results are positive, we will call you.  You and your sexual partner(s) may require treatment at that time.  Do not have sexual activity for at least 7 days.    Follow-up with your primary care provider.        ED Prescriptions   None    PDMP not reviewed this encounter.   Mickie Bail, NP 10/26/22 [phone]

## 2022-10-26 NOTE — ED Triage Notes (Signed)
Patient to Urgent Care for STD testing/ also requests blood work.  Reports that his sexual partner has an STD and he was informed by her that so does his baby. Unsure of what this could be. Denies any current symptoms.

## 2022-10-27 LAB — RPR: RPR Ser Ql: NONREACTIVE

## 2022-10-27 LAB — CYTOLOGY, (ORAL, ANAL, URETHRAL) ANCILLARY ONLY
Chlamydia: NEGATIVE
Comment: NEGATIVE
Comment: NEGATIVE
Comment: NORMAL
Neisseria Gonorrhea: NEGATIVE
Trichomonas: NEGATIVE

## 2022-10-27 LAB — HIV ANTIBODY (ROUTINE TESTING W REFLEX): HIV Screen 4th Generation wRfx: NONREACTIVE

## 2022-12-16 ENCOUNTER — Other Ambulatory Visit: Payer: Self-pay

## 2022-12-16 ENCOUNTER — Emergency Department
Admission: EM | Admit: 2022-12-16 | Discharge: 2022-12-16 | Disposition: A | Discharge status: Home / Self Care | Payer: Medicaid Other | Attending: Emergency Medicine | Admitting: Emergency Medicine

## 2022-12-16 DIAGNOSIS — Z794 Long term (current) use of insulin: Secondary | ICD-10-CM | POA: Diagnosis not present

## 2022-12-16 DIAGNOSIS — I10 Essential (primary) hypertension: Secondary | ICD-10-CM | POA: Diagnosis not present

## 2022-12-16 DIAGNOSIS — E139 Other specified diabetes mellitus without complications: Secondary | ICD-10-CM

## 2022-12-16 DIAGNOSIS — E1165 Type 2 diabetes mellitus with hyperglycemia: Secondary | ICD-10-CM

## 2022-12-16 DIAGNOSIS — E119 Type 2 diabetes mellitus without complications: Secondary | ICD-10-CM | POA: Diagnosis not present

## 2022-12-16 DIAGNOSIS — Z760 Encounter for issue of repeat prescription: Secondary | ICD-10-CM | POA: Insufficient documentation

## 2022-12-16 LAB — CBG MONITORING, ED: Glucose-Capillary: 240 mg/dL — ABNORMAL HIGH (ref 70–99)

## 2022-12-16 MED ORDER — INSUPEN PEN NEEDLES 32G X 4 MM MISC: 3 refills | Status: AC

## 2022-12-16 MED ORDER — GLUCOSE BLOOD VI STRP: ORAL_STRIP | 1 refills | Status: AC

## 2022-12-16 MED ORDER — AMLODIPINE BESYLATE 10 MG PO TABS
10.0000 mg | ORAL_TABLET | Freq: Every day | ORAL | 0 refills | Status: AC
Start: 2022-12-16 — End: ?

## 2022-12-16 MED ORDER — INSULIN ASPART FLEXPEN 100 UNIT/ML ~~LOC~~ SOPN: PEN_INJECTOR | SUBCUTANEOUS | 3 refills | Status: AC

## 2022-12-16 MED ORDER — ACCU-CHEK FASTCLIX LANCETS MISC: 1.0000 | 1 refills | Status: AC

## 2022-12-16 MED ORDER — METFORMIN HCL 500 MG PO TABS: ORAL_TABLET | ORAL | 3 refills | Status: AC

## 2022-12-16 NOTE — Discharge Instructions (Addendum)
Your medications were refilled for you.  Please follow-up with your outpatient specialist.  Please return for any new, worsening, or change in symptoms or other concerns.

## 2022-12-16 NOTE — ED Provider Notes (Signed)
[facility] Provider Note    Event Date/Time   First MD Initiated Contact with Patient 12/16/22 1510     (approximate)   History   Medication Refill   HPI  Dakota Silva is a 22 y.o. male with a past medical history of diabetes and hypertension who presents today with request for medication refill.  Patient reports that he last took his medicines yesterday.  He reports that he does not have an appointment with his PCP until next month.  He is requesting a refill of his medications.  He reports otherwise he feels his normal self.  He reports that he needs his diabetes medications and his amlodipine.  Patient Active Problem List   Diagnosis Date Noted   Chest pain of uncertain etiology 01/30/2020   Palpitations 01/30/2020   Obesity, diabetes, and hypertension syndrome (HCC) 12/31/2019   Uncontrolled type 2 diabetes mellitus with hyperglycemia (HCC) 09/13/2018   Maladaptive health behaviors affecting medical condition 10/13/2017   Noncompliance with diabetes treatment 09/27/2017   Abnormal weight loss 09/27/2017   Insulin dose changed (HCC) 09/27/2017   Acanthosis nigricans 01/20/2017   Morbid obesity (HCC) 01/20/2017   AKI (acute kidney injury) (HCC)    Dehydration    Hyperglycemia    Hyponatremia    DKA (diabetic ketoacidosis) (HCC) 11/29/2016   Essential hypertension in pediatric patient 09/21/2016   New onset of type 2 diabetes mellitus in pediatric patient (HCC) 09/21/2016   Anemia 09/21/2016          Physical Exam   Triage Vital Signs: ED Triage Vitals  Encounter Vitals Group     BP 12/16/22 1427 (!) 142/102     Systolic BP Percentile --      Diastolic BP Percentile --      Pulse Rate 12/16/22 1427 93     Resp 12/16/22 1427 16     Temp 12/16/22 1427 97.9 F (36.6 C)     Temp Source 12/16/22 1427 Oral     SpO2 12/16/22 1427 97 %     Weight 12/16/22 1425 244 lb 14.9 oz (111.1 kg)     Height 12/16/22 1425 5\' 11"  (1.803 m)     Head  Circumference --      Peak Flow --      Pain Score 12/16/22 1425 0     Pain Loc --      Pain Education --      Exclude from Growth Chart --     Most recent vital signs: Vitals:   12/16/22 1427 12/16/22 1539  BP: (!) 142/102 127/82  Pulse: 93 98  Resp: 16 19  Temp: 97.9 F (36.6 C)   SpO2: 97% 100%    Physical Exam Vitals and nursing note reviewed.  Constitutional:      General: Awake and alert. No acute distress.    Appearance: Normal appearance. The patient is obese.  HENT:     Head: Normocephalic and atraumatic.     Mouth: Mucous membranes are moist.  Eyes:     General: PERRL. Normal EOMs        Right eye: No discharge.        Left eye: No discharge.     Conjunctiva/sclera: Conjunctivae normal.  Cardiovascular:     Rate and Rhythm: Normal rate and regular rhythm.     Pulses: Normal pulses.  Pulmonary:     Effort: Pulmonary effort is normal. No respiratory distress.     Breath sounds: Normal breath sounds.  Abdominal:     Abdomen is soft. There is no abdominal tenderness. No rebound or guarding. No distention. Musculoskeletal:        General: No swelling. Normal range of motion.     Cervical back: Normal range of motion and neck supple.  Skin:    General: Skin is warm and dry.     Capillary Refill: Capillary refill takes less than 2 seconds.     Findings: No rash.  Neurological:     Mental Status: The patient is awake and alert.      ED Results / Procedures / Treatments   Labs (all labs ordered are listed, but only abnormal results are displayed) Labs Reviewed  CBG MONITORING, ED - Abnormal; Notable for the following components:      Result Value   Glucose-Capillary 240 (*)    All other components within normal limits     EKG     RADIOLOGY     PROCEDURES:  Critical Care performed:   Procedures   MEDICATIONS ORDERED IN ED: Medications - No data to display   IMPRESSION / MDM / ASSESSMENT AND PLAN / ED COURSE  I reviewed the triage  vital signs and the nursing notes.   Differential diagnosis includes, but is not limited to, hyperglycemia, medication refill, hypertension.  Patient is awake and alert, hemodynamically stable and afebrile.  He is nontoxic in appearance.  He has no complaints today besides request for medication refill.  I reviewed the patient's chart.  Patient was seen last year with same complaint.  Blood glucose obtained in triage is 240.  Patient has no nausea, vomiting, abdominal pain, or symptoms that would be worrisome for DKA at this time.  He reports that he has had DKA in the past and this feels nothing like it.  His medications were refilled for him, his mother confirmed the medications that were needed and the proper dosages.  She reports that he is able to get a PCP next month.  We discussed return precautions and the importance of close outpatient follow-up.  Patient or stands and agrees with plan.  He was discharged in stable condition.   Patient's presentation is most consistent with severe exacerbation of chronic illness.    FINAL CLINICAL IMPRESSION(S) / ED DIAGNOSES   Final diagnoses:  Medication refill  Other specified diabetes mellitus without complication, with long-term current use of insulin (HCC)     Rx / DC Orders   ED Discharge Orders          Ordered    Accu-Chek FastClix Lancets MISC  As directed       Note to Pharmacy: Lancets come in boxes of 102 each. Please dispense 2 boxes. For questions regarding this prescription please call [phone].   12/16/22 1528    amLODipine (NORVASC) 10 MG tablet  Daily        12/16/22 1528    glucose blood (ACCU-CHEK GUIDE) test strip        12/16/22 1528    Insulin Aspart FlexPen (NOVOLOG) 100 UNIT/ML        12/16/22 1528    Insulin Pen Needle (INSUPEN PEN NEEDLES) 32G X 4 MM MISC        12/16/22 1528    metFORMIN (GLUCOPHAGE) 500 MG tablet        12/16/22 1528             Note:  This document was prepared using  Dragon voice recognition software and may include unintentional dictation  errors.   Jackelyn Hoehn, PA-C 12/16/22 1626    Pilar Jarvis, MD 12/16/22 1705

## 2022-12-16 NOTE — ED Triage Notes (Signed)
Pt here for a medication refill. Pt is here for a novolog refill. Pt is in the process of finding a new primary provider. Pt denies any symptoms.

## 2023-04-25 ENCOUNTER — Encounter (INDEPENDENT_AMBULATORY_CARE_PROVIDER_SITE_OTHER): Payer: Self-pay

## 2023-05-08 ENCOUNTER — Encounter (INDEPENDENT_AMBULATORY_CARE_PROVIDER_SITE_OTHER): Payer: Self-pay

## 2024-01-17 IMAGING — CR DG FOOT COMPLETE 3+V*R*
3 series · 3 of 3 positions shown · non-contrast
Comparison: None.

CLINICAL DATA: Right foot injury.

EXAM:
RIGHT FOOT COMPLETE - 3 VIEW

[x foot ap right]
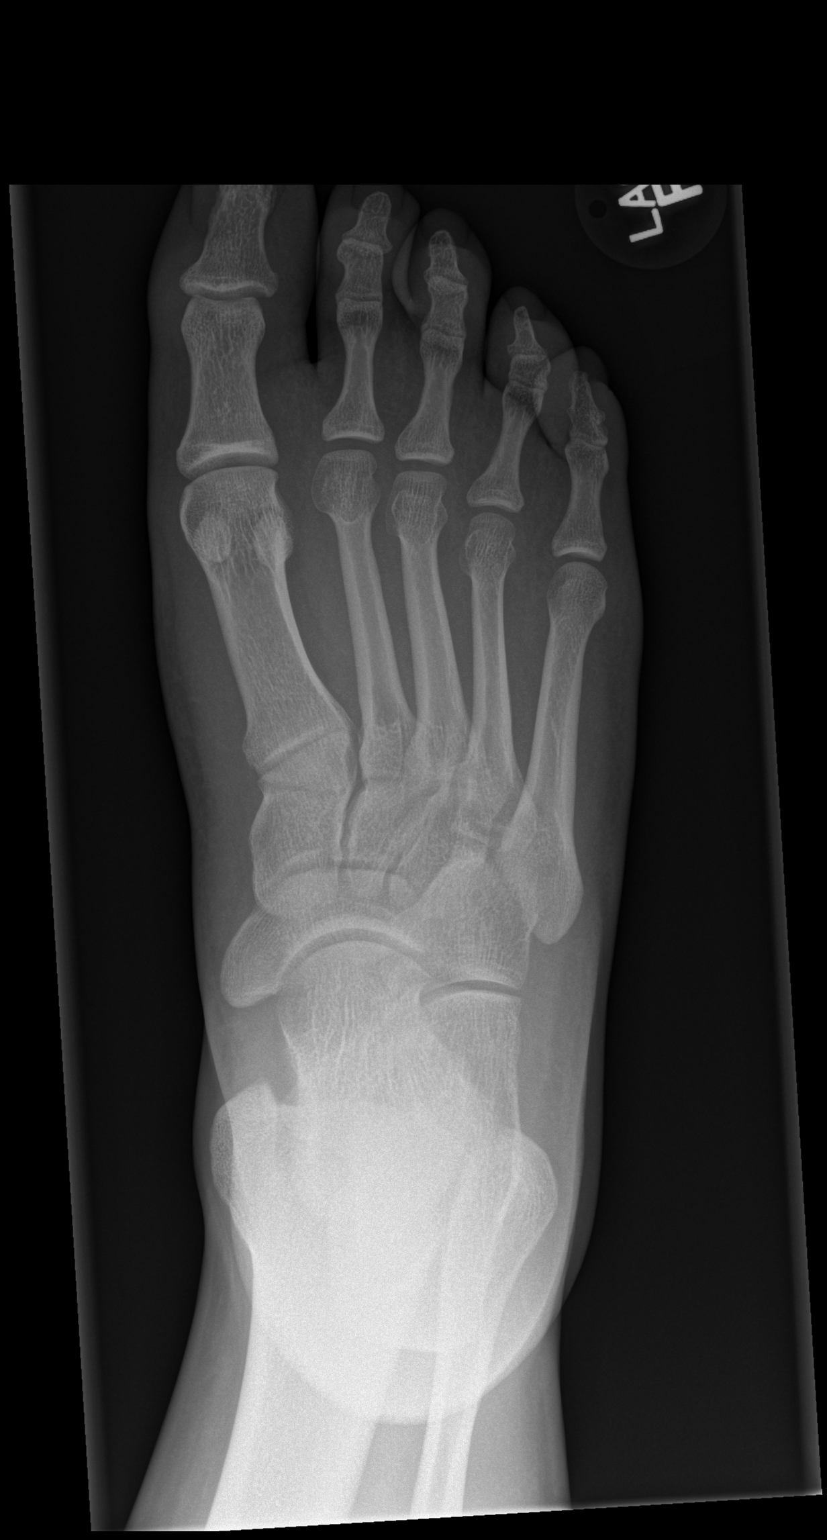

[x foot obl right]
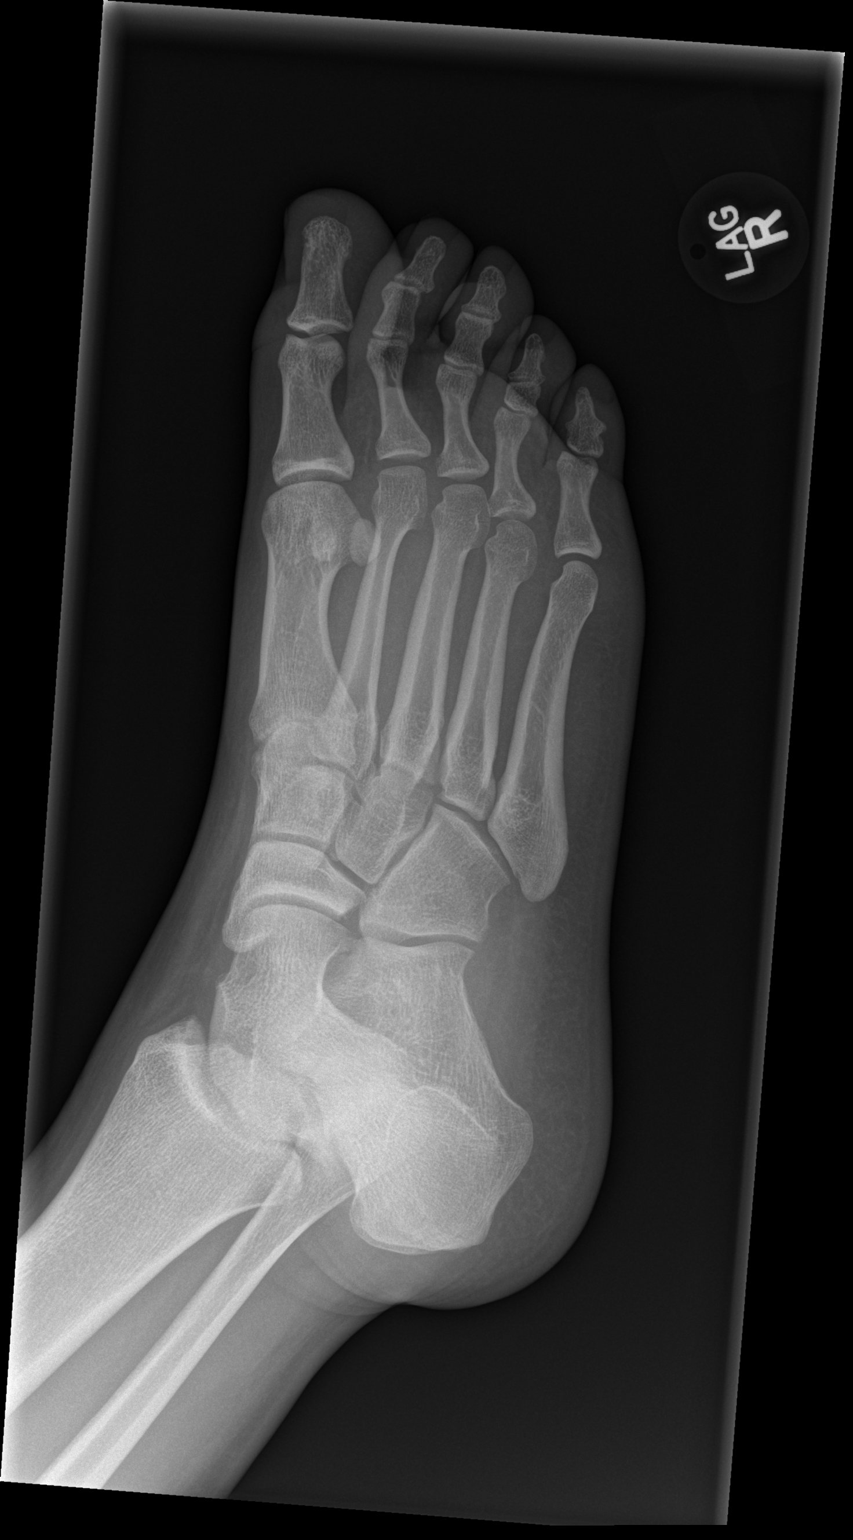

[x foot lat right]
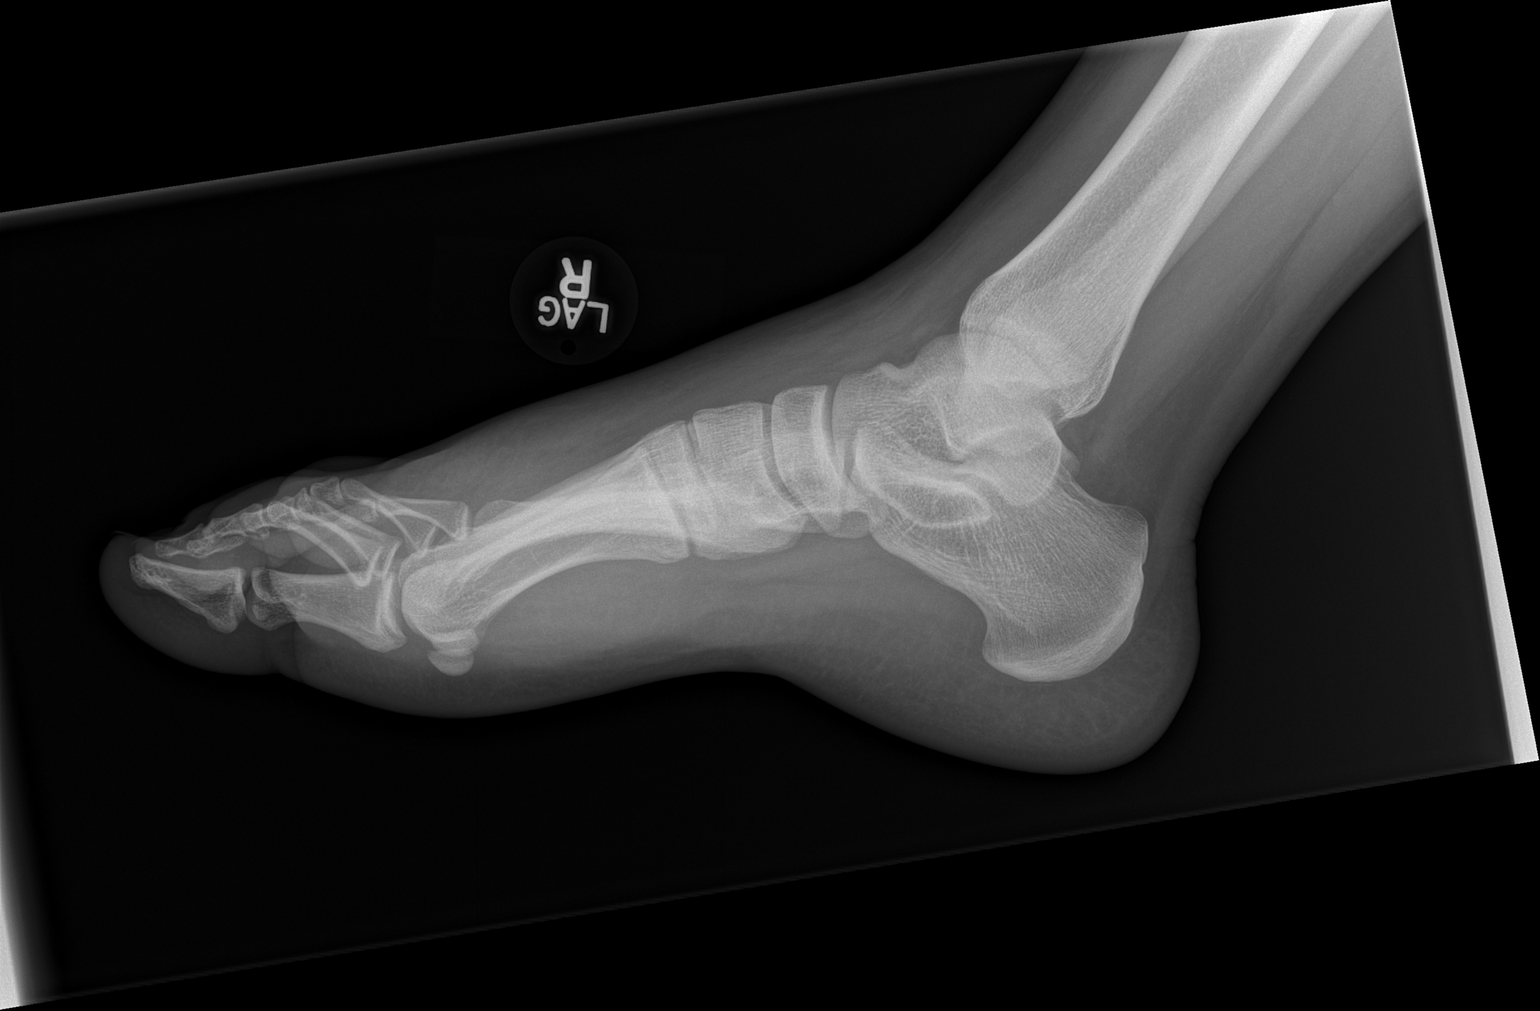

[3 of 3 positions shown; findings below may reference images not displayed]

FINDINGS: There is no evidence of fracture or dislocation. There is no
evidence of arthropathy or other focal bone abnormality. Soft tissue
swelling of the forefoot.
IMPRESSION: No acute osseous abnormality.
